# Patient Record
Sex: Female | Born: 1985 | Race: White | Hispanic: No | Smoking: Current every day smoker
Health system: Southern US, Community
[De-identification: ages and names within clinical notes are randomized; demographics above are authoritative.]

## PROBLEM LIST (undated history)

## (undated) DIAGNOSIS — F319 Bipolar disorder, unspecified: Secondary | ICD-10-CM

---

## 2015-07-07 ENCOUNTER — Encounter (HOSPITAL_COMMUNITY): Payer: Self-pay | Admitting: Emergency Medicine

## 2015-07-07 ENCOUNTER — Emergency Department (HOSPITAL_COMMUNITY)
Admission: EM | Admit: 2015-07-07 | Discharge: 2015-07-07 | Disposition: A | Discharge status: Home / Self Care | Payer: BLUE CROSS/BLUE SHIELD | Attending: Emergency Medicine | Admitting: Emergency Medicine

## 2015-07-07 DIAGNOSIS — S50861A Insect bite (nonvenomous) of right forearm, initial encounter: Secondary | ICD-10-CM | POA: Diagnosis present

## 2015-07-07 DIAGNOSIS — W57XXXA Bitten or stung by nonvenomous insect and other nonvenomous arthropods, initial encounter: Secondary | ICD-10-CM | POA: Diagnosis not present

## 2015-07-07 DIAGNOSIS — F17200 Nicotine dependence, unspecified, uncomplicated: Secondary | ICD-10-CM | POA: Insufficient documentation

## 2015-07-07 DIAGNOSIS — Y9389 Activity, other specified: Secondary | ICD-10-CM | POA: Insufficient documentation

## 2015-07-07 DIAGNOSIS — Z8659 Personal history of other mental and behavioral disorders: Secondary | ICD-10-CM | POA: Insufficient documentation

## 2015-07-07 DIAGNOSIS — L03113 Cellulitis of right upper limb: Secondary | ICD-10-CM | POA: Diagnosis not present

## 2015-07-07 DIAGNOSIS — Y998 Other external cause status: Secondary | ICD-10-CM | POA: Insufficient documentation

## 2015-07-07 DIAGNOSIS — Y9289 Other specified places as the place of occurrence of the external cause: Secondary | ICD-10-CM | POA: Insufficient documentation

## 2015-07-07 HISTORY — DX: Bipolar disorder, unspecified: F31.9

## 2015-07-07 MED ORDER — CEPHALEXIN 500 MG PO CAPS: 500.0000 mg | ORAL_CAPSULE | Freq: Four times a day (QID) | ORAL | Status: AC

## 2015-07-07 MED ORDER — CEPHALEXIN 500 MG PO CAPS
500.0000 mg | ORAL_CAPSULE | Freq: Once | ORAL | Status: AC
  Administered 2015-07-07: 500 mg via ORAL
  Filled 2015-07-07: qty 1

## 2015-07-07 NOTE — ED Notes (Signed)
Pt states she noticed a bite on her right inner elbow area early yesterday afternoon and now it has a red steak that goes vertically across her arm

## 2015-07-07 NOTE — ED Notes (Signed)
PA at bedside.

## 2015-07-07 NOTE — Discharge Instructions (Signed)
Take keflex as directed until gone. Refer to attached documents for more information. Return to the ED with worsening or concerning symptoms.  °

## 2015-07-07 NOTE — ED Provider Notes (Signed)
CSN: [id number]     Arrival date & time 07/07/15  [id number] History   First MD Initiated Contact with Patient 07/07/15 0400     Chief Complaint  Patient presents with  . Insect Bite     (Consider location/radiation/quality/duration/timing/severity/associated sxs/prior Treatment) Patient is a 29 y.o. female presenting with animal bite. The history is provided by the patient. No language interpreter was used.  Animal Bite Contact animal:  Insect Location:  Shoulder/arm Shoulder/arm injury location:  R forearm Time since incident:  8 hours Pain details:    Quality:  Burning   Severity:  Mild   Timing:  Constant   Progression:  Unchanged Incident location:  Home Provoked: unprovoked   Relieved by:  Nothing Worsened by:  Nothing tried Ineffective treatments:  None tried Associated symptoms: no fever, no numbness, no rash and no swelling     Past Medical History  Diagnosis Date  . Bipolar disorder (HCC)    History reviewed. No pertinent past surgical history. Family History  Problem Relation Age of Onset  . Stroke Father    Social History  Substance Use Topics  . Smoking status: Current Every Day Smoker  . Smokeless tobacco: None  . Alcohol Use: Yes     Comment: occ   OB History    No data available     Review of Systems  Constitutional: Negative for fever.  Skin: Positive for wound. Negative for rash.  Neurological: Negative for numbness.  All other systems reviewed and are negative.     Allergies  Review of patient's allergies indicates no known allergies.  Home Medications   Prior to Admission medications   Not on File   BP 131/85 mmHg  Pulse 93  Temp(Src) 97.9 F (36.6 C) (Oral)  Resp 16  SpO2 100%  LMP 06/21/2015 (Approximate) Physical Exam  Constitutional: She is oriented to person, place, and time. She appears well-developed and well-nourished. No distress.  HENT:  Head: Normocephalic and atraumatic.  Eyes: Conjunctivae and EOM are normal.   Neck: Normal range of motion.  Cardiovascular: Normal rate and regular rhythm.  Exam reveals no gallop and no friction rub.   No murmur heard. Pulmonary/Chest: Effort normal and breath sounds normal. She has no wheezes. She has no rales. She exhibits no tenderness.  Abdominal: Soft. She exhibits no distension. There is no tenderness. There is no rebound.  Musculoskeletal: Normal range of motion.  Neurological: She is alert and oriented to person, place, and time. Coordination normal.  Speech is goal-oriented. Moves limbs without ataxia.   Skin: Skin is warm and dry.  2x2cm area of erythema of right inner elbow with small amount of streaking extending from the area.   Psychiatric: She has a normal mood and affect. Her behavior is normal.  Nursing note and vitals reviewed.   ED Course  Procedures (including critical care time) Labs Review Labs Reviewed - No data to display  Imaging Review No results found. I have personally reviewed and evaluated these images and lab results as part of my medical decision-making.   EKG Interpretation None      MDM   Final diagnoses:  Cellulitis of right upper extremity    4:20 AM Patient appears to have a localized infection from an insect bite of her right inner arm. Patient will be treated with keflex. Vitals stable and patient afebrile.     Emilia BeckKaitlyn Aarini Slee, PA-C 07/07/15 [phone]  Devoria AlbeIva Knapp, MD 07/07/15 [phone]

## 2019-05-16 ENCOUNTER — Ambulatory Visit (INDEPENDENT_AMBULATORY_CARE_PROVIDER_SITE_OTHER): Payer: Worker's Compensation

## 2019-05-16 ENCOUNTER — Ambulatory Visit (HOSPITAL_COMMUNITY)
Admission: EM | Admit: 2019-05-16 | Discharge: 2019-05-16 | Disposition: A | Discharge status: Home / Self Care | Payer: Worker's Compensation | Attending: Internal Medicine | Admitting: Internal Medicine

## 2019-05-16 ENCOUNTER — Ambulatory Visit (HOSPITAL_COMMUNITY): Payer: BLUE CROSS/BLUE SHIELD

## 2019-05-16 ENCOUNTER — Other Ambulatory Visit: Payer: Self-pay

## 2019-05-16 ENCOUNTER — Encounter (HOSPITAL_COMMUNITY): Payer: Self-pay

## 2019-05-16 DIAGNOSIS — S92314A Nondisplaced fracture of first metatarsal bone, right foot, initial encounter for closed fracture: Secondary | ICD-10-CM

## 2019-05-16 MED ORDER — KETOROLAC TROMETHAMINE 60 MG/2ML IM SOLN
60.0000 mg | Freq: Once | INTRAMUSCULAR | Status: AC
  Administered 2019-05-16: 60 mg via INTRAMUSCULAR

## 2019-05-16 MED ORDER — IBUPROFEN 600 MG PO TABS: 600.0000 mg | ORAL_TABLET | Freq: Four times a day (QID) | ORAL | 0 refills | Status: AC | PRN

## 2019-05-16 MED ORDER — KETOROLAC TROMETHAMINE 60 MG/2ML IM SOLN
INTRAMUSCULAR | Status: AC
  Filled 2019-05-16: qty 2

## 2019-05-16 NOTE — ED Triage Notes (Addendum)
Pt state she was trying to break a piece of wood and she hurt her right foot. ( by stomping it )

## 2019-05-16 NOTE — ED Provider Notes (Signed)
[facility]    CSN: [id number] Arrival date & time: 05/16/19  1614      History   Chief Complaint Chief Complaint  Patient presents with  . Foot Pain    HPI Sandra Peters is a 33 y.o. female with a history of bipolar disorder comes to urgent care with complaint of right foot pain that started after she stomped on a pile of wood.  Pain is throbbing and severe.  Pain is currently 10 out of 10.  Pain is aggravated by putting weight on the foot.  No known relieving factors.  Denies any numbness or tingling.  No fever or chills.  No bruising on the leg.  Patient is not able to bear weight on the foot. HPI  Past Medical History:  Diagnosis Date  . Bipolar disorder (HCC)     There are no active problems to display for this patient.   History reviewed. No pertinent surgical history.  OB History   No obstetric history on file.      Home Medications    Prior to Admission medications   Medication Sig Start Date End Date Taking? Authorizing Provider  cephALEXin (KEFLEX) 500 MG capsule Take 1 capsule (500 mg total) by mouth 4 (four) times daily. 07/07/15   Szekalski, Yvonna Alanis, PA-C  ibuprofen (ADVIL) 600 MG tablet Take 1 tablet (600 mg total) by mouth every 6 (six) hours as needed. 05/16/19   LampteyBritta Mccreedy, MD    Family History Family History  Problem Relation Age of Onset  . Stroke Father     Social History Social History   Tobacco Use  . Smoking status: Current Every Day Smoker  . Smokeless tobacco: Never Used  Substance Use Topics  . Alcohol use: Yes    Comment: occ  . Drug use: No     Allergies   Patient has no known allergies.   Review of Systems Review of Systems  Constitutional: Positive for activity change. Negative for fatigue, fever and unexpected weight change.  HENT: Negative.   Respiratory: Negative.   Cardiovascular: Negative.   Genitourinary: Negative.   Musculoskeletal: Positive for arthralgias. Negative for back pain, gait  problem, joint swelling and myalgias.  Skin: Negative.   Neurological: Negative.   Psychiatric/Behavioral: Negative.      Physical Exam Triage Vital Signs ED Triage Vitals  Enc Vitals Group     BP 05/16/19 1702 (!) 137/103     Pulse Rate 05/16/19 1702 84     Resp 05/16/19 1702 17     Temp 05/16/19 1702 98.1 F (36.7 C)     Temp Source 05/16/19 1702 Tympanic     SpO2 05/16/19 1702 100 %     Weight 05/16/19 1705 230 lb (104.3 kg)     Height --      Head Circumference --      Peak Flow --      Pain Score 05/16/19 1705 8     Pain Loc --      Pain Edu? --      Excl. in GC? --    No data found.  Updated Vital Signs BP (!) 137/103 (BP Location: Right Arm)   Pulse 84   Temp 98.1 F (36.7 C) (Tympanic)   Resp 17   Wt 104.3 kg   LMP 05/02/2019   SpO2 100%   Visual Acuity Right Eye Distance:   Left Eye Distance:   Bilateral Distance:    Right Eye Near:   Left Eye  Near:    Bilateral Near:     Physical Exam Constitutional:      General: She is in acute distress.  Cardiovascular:     Pulses: Normal pulses.     Heart sounds: Normal heart sounds.  Pulmonary:     Effort: Pulmonary effort is normal.     Breath sounds: Normal breath sounds.  Abdominal:     General: Bowel sounds are normal.     Palpations: Abdomen is soft.  Musculoskeletal:        General: Tenderness and signs of injury present. No swelling or deformity.  Skin:    General: Skin is warm.     Capillary Refill: Capillary refill takes less than 2 seconds.  Neurological:     General: No focal deficit present.     Mental Status: She is alert and oriented to person, place, and time.      UC Treatments / Results  Labs (all labs ordered are listed, but only abnormal results are displayed) Labs Reviewed - No data to display  EKG   Radiology Dg Foot Complete Right  Result Date: 05/16/2019 CLINICAL DATA:  33 year old female with injured foot EXAM: RIGHT FOOT COMPLETE - 3+ VIEW COMPARISON:  None.  FINDINGS: Acute nondisplaced transverse fractures at the base of the fourth and fifth metatarsal with associated soft tissue swelling. No significant degenerative changes. No radiopaque foreign body. Lisfranc joint appears unremarkable. IMPRESSION: Acute fracture at the base of the fourth and fifth metatarsal. Electronically Signed   By: Corrie Mckusick D.O.   On: 05/16/2019 17:57    Procedures Procedures (including critical care time)  Medications Ordered in UC Medications  ketorolac (TORADOL) injection 60 mg (60 mg Intramuscular Given 05/16/19 1816)  ketorolac (TORADOL) 60 MG/2ML injection (has no administration in time range)    Initial Impression / Assessment and Plan / UC Course  I have reviewed the triage vital signs and the nursing notes.  Pertinent labs & imaging results that were available during my care of the patient were reviewed by me and considered in my medical decision making (see chart for details).     1.  Right foot pain (nondisplaced  fourth and fifth metatarsal fracture): Toradol 60 mg IM x1 dose Ibuprofen 600 mg every 6 hours as needed for pain Postop boot Follow-up with orthopedic surgery Dr. Griffin Basil If patient experiences worsening neck pain or increased bruising she is advised to return to the urgent care to be reevaluated. Final Clinical Impressions(s) / UC Diagnoses   Final diagnoses:  Closed nondisplaced fracture of first metatarsal bone of right foot, initial encounter   Discharge Instructions   None    ED Prescriptions    Medication Sig Dispense Auth. Provider   ibuprofen (ADVIL) 600 MG tablet Take 1 tablet (600 mg total) by mouth every 6 (six) hours as needed. 30 tablet , Myrene Galas, MD     PDMP not reviewed this encounter.   Chase Picket, MD 05/16/19 [phone]

## 2019-11-14 ENCOUNTER — Ambulatory Visit: Payer: Self-pay | Attending: Internal Medicine

## 2019-11-14 DIAGNOSIS — Z23 Encounter for immunization: Secondary | ICD-10-CM

## 2019-11-14 NOTE — Progress Notes (Signed)
   Covid-19 Vaccination Clinic  Name:  Sandra Peters    MRN: [id number] DOB: 1985-09-15  11/14/2019  [name] was observed post Covid-19 immunization for 15 minutes without incident. She was provided with Vaccine Information Sheet and instruction to access the V-Safe system.   [name] was instructed to call 911 with any severe reactions post vaccine: Marland Kitchen Difficulty breathing  . Swelling of face and throat  . A fast heartbeat  . A bad rash all over body  . Dizziness and weakness   Immunizations Administered    Name Date Dose VIS Date Route   Moderna COVID-19 Vaccine 11/14/2019  8:31 AM 0.5 mL 07/23/2019 Intramuscular   Manufacturer: Moderna   Lot: [id number]   NDC: 71696-789-38

## 2019-12-17 ENCOUNTER — Ambulatory Visit: Payer: Self-pay | Attending: Internal Medicine

## 2019-12-17 DIAGNOSIS — Z23 Encounter for immunization: Secondary | ICD-10-CM

## 2019-12-17 NOTE — Progress Notes (Signed)
   Covid-19 Vaccination Clinic  Name:  Sandra Peters    MRN: [id number] DOB: Feb 05, 1986  12/17/2019  [name] was observed post Covid-19 immunization for 15 minutes without incident. She was provided with Vaccine Information Sheet and instruction to access the V-Safe system.   [name] was instructed to call 911 with any severe reactions post vaccine: Marland Kitchen Difficulty breathing  . Swelling of face and throat  . A fast heartbeat  . A bad rash all over body  . Dizziness and weakness   Immunizations Administered    Name Date Dose VIS Date Route   Moderna COVID-19 Vaccine 12/17/2019  9:16 AM 0.5 mL 07/2019 Intramuscular   Manufacturer: Moderna   Lot: [id number]   NDC: 46503-546-56

## 2021-04-21 IMAGING — DX DG FOOT COMPLETE 3+V*R*
3 series · 3 of 3 positions shown · non-contrast
Comparison: None.

CLINICAL DATA: 33-year-old female with injured foot

EXAM:
RIGHT FOOT COMPLETE - 3+ VIEW

[foot ap]
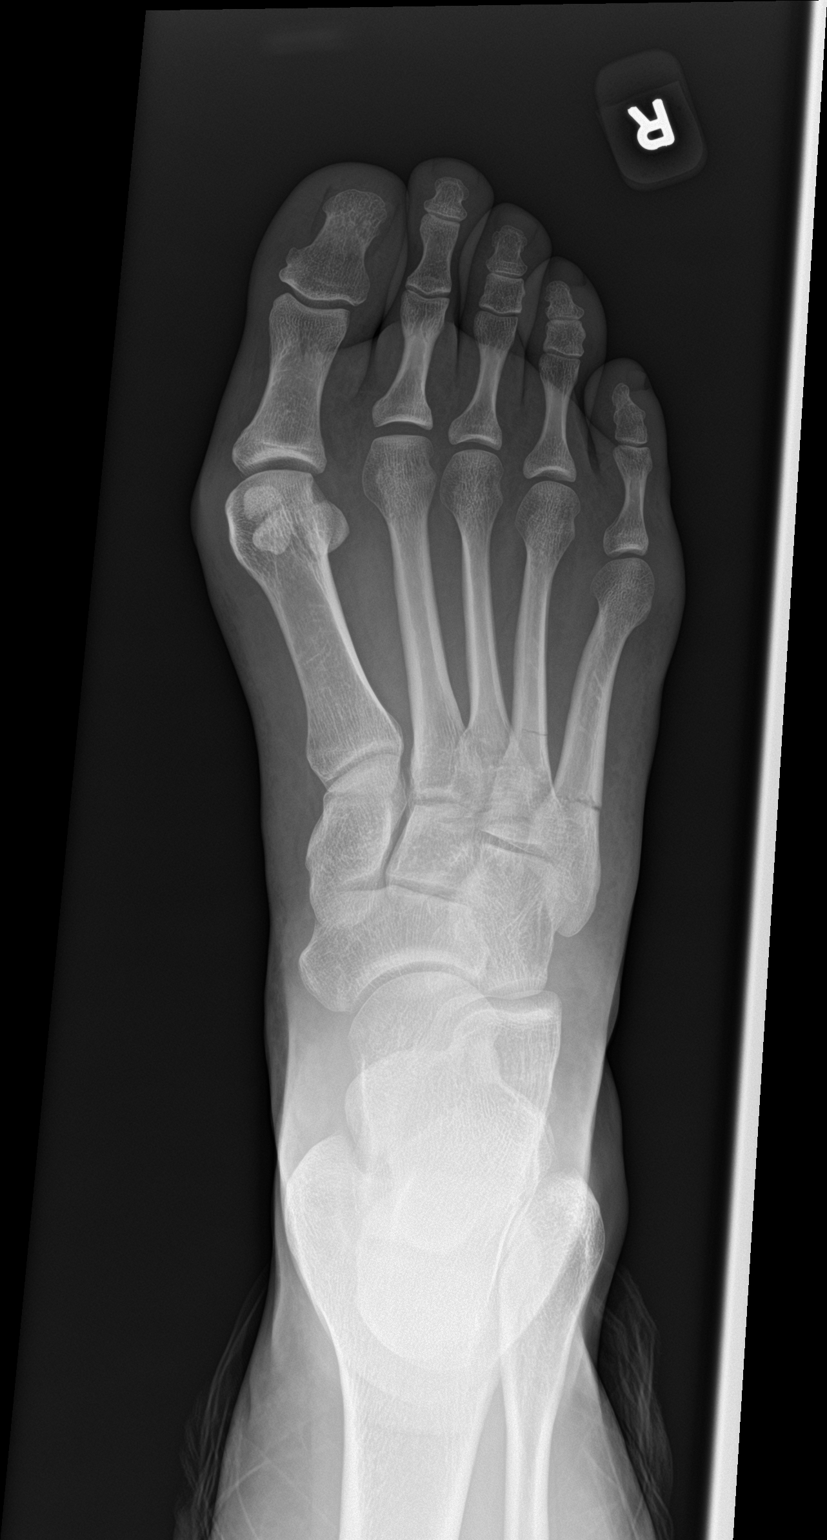

[foot obl]
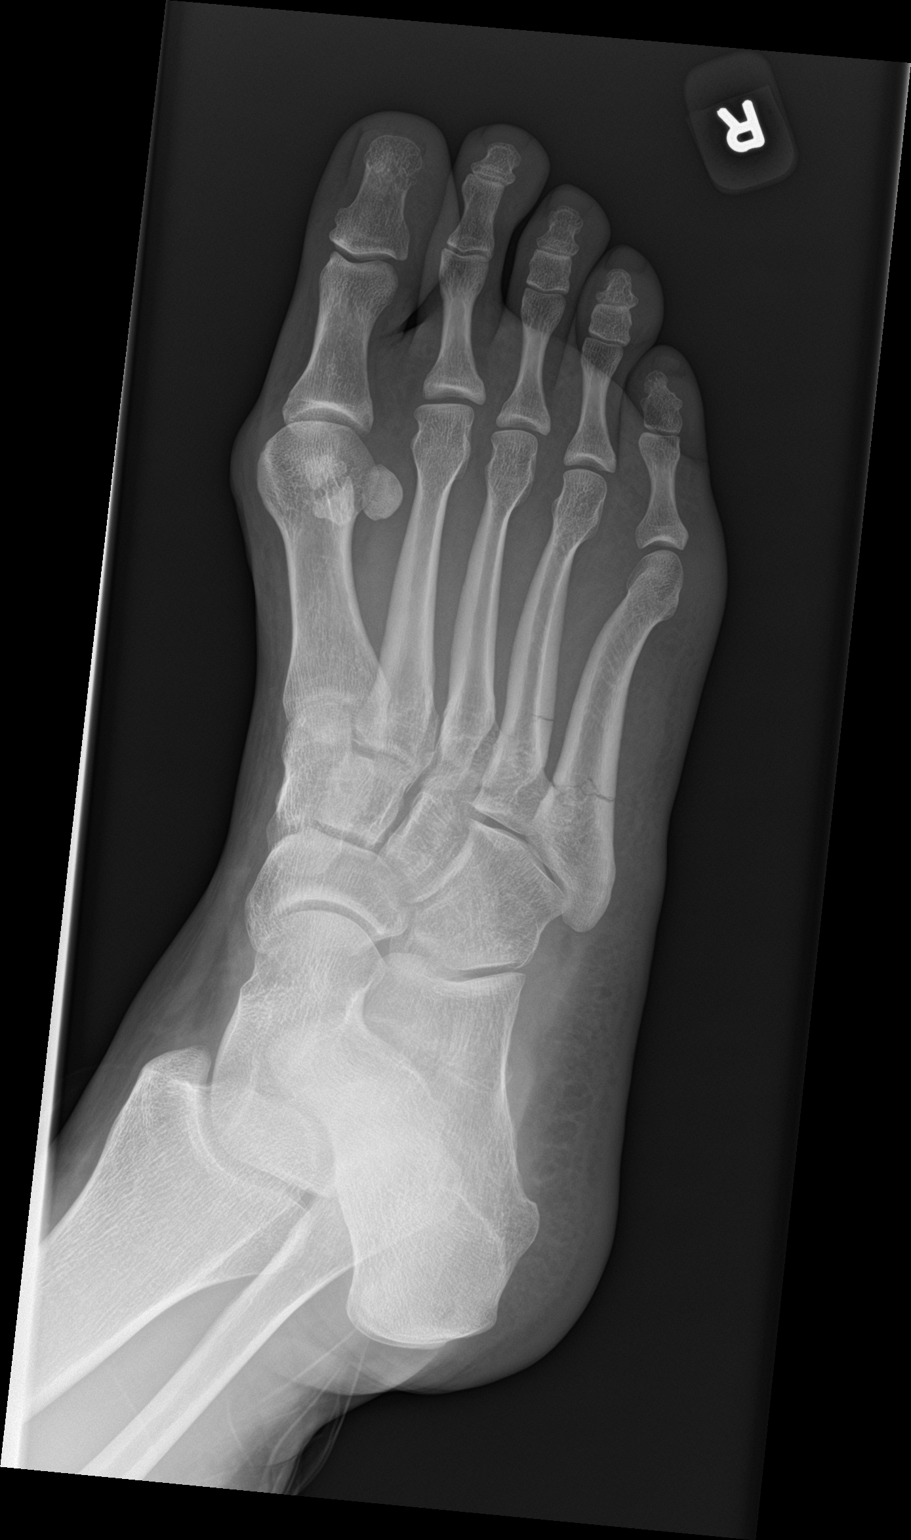

[foot lat]
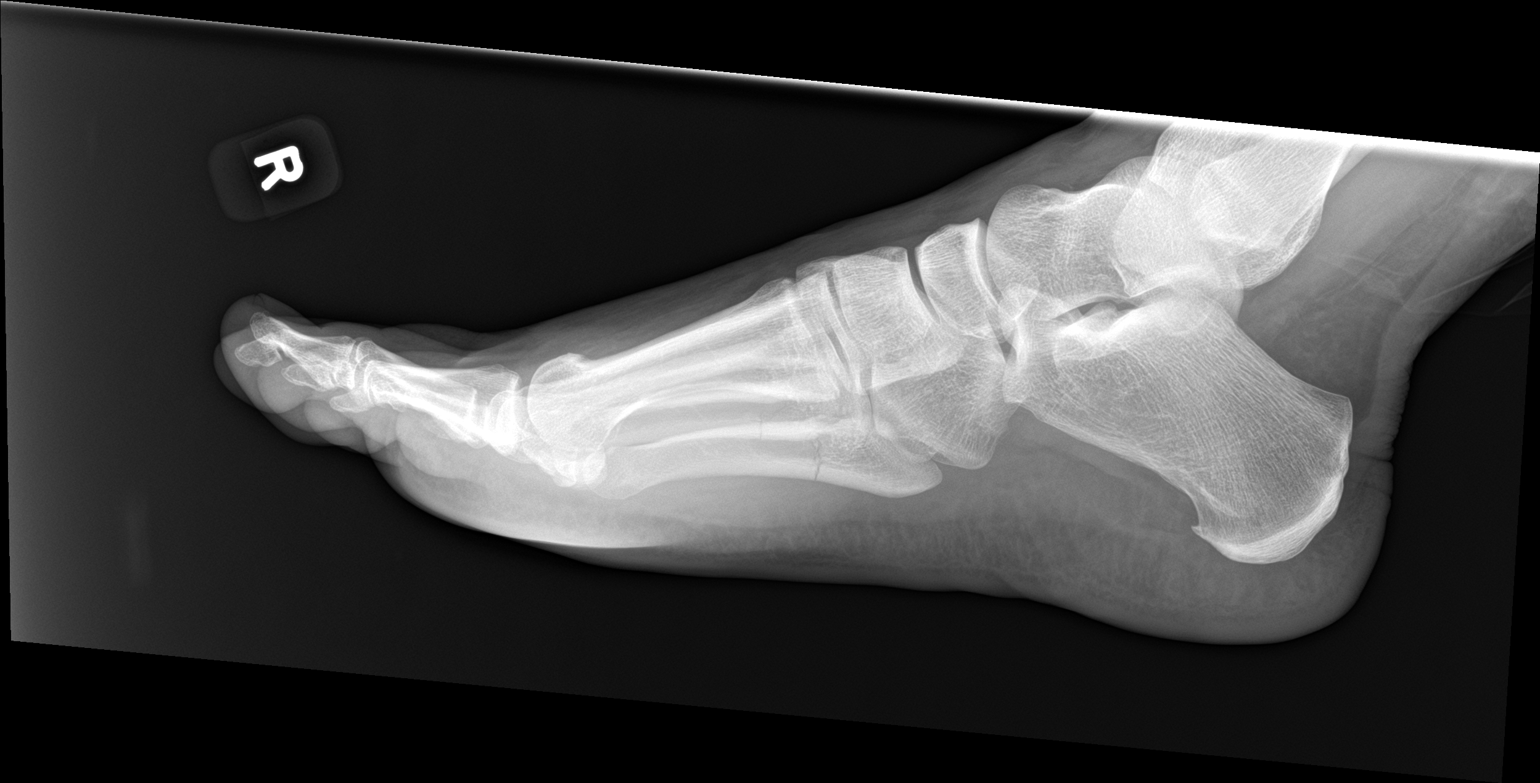

[3 of 3 positions shown; findings below may reference images not displayed]

FINDINGS: Acute nondisplaced transverse fractures at the base of the fourth
and fifth metatarsal with associated soft tissue swelling.

No significant degenerative changes. No radiopaque foreign body.
Lisfranc joint appears unremarkable.
IMPRESSION: Acute fracture at the base of the fourth and fifth metatarsal.
